# Patient Record
Sex: Female | Born: 1940 | Race: Black or African American | Hispanic: No | Marital: Single | Smoking: Former smoker
Health system: Southern US, Community
[De-identification: ages and names within clinical notes are randomized; demographics above are authoritative.]

## PROBLEM LIST (undated history)

## (undated) DIAGNOSIS — E119 Type 2 diabetes mellitus without complications: Secondary | ICD-10-CM

## (undated) DIAGNOSIS — H349 Unspecified retinal vascular occlusion: Secondary | ICD-10-CM

## (undated) DIAGNOSIS — H409 Unspecified glaucoma: Secondary | ICD-10-CM

## (undated) DIAGNOSIS — J302 Other seasonal allergic rhinitis: Secondary | ICD-10-CM

## (undated) DIAGNOSIS — I10 Essential (primary) hypertension: Secondary | ICD-10-CM

## (undated) HISTORY — PX: ABDOMINAL HYSTERECTOMY: SHX81

---

## 2017-04-21 ENCOUNTER — Encounter (HOSPITAL_BASED_OUTPATIENT_CLINIC_OR_DEPARTMENT_OTHER): Payer: Self-pay | Admitting: Emergency Medicine

## 2017-04-21 ENCOUNTER — Emergency Department (HOSPITAL_BASED_OUTPATIENT_CLINIC_OR_DEPARTMENT_OTHER)
Admission: EM | Admit: 2017-04-21 | Discharge: 2017-04-21 | Disposition: A | Discharge status: Home / Self Care | Payer: Medicare Other | Attending: Physician Assistant | Admitting: Physician Assistant

## 2017-04-21 ENCOUNTER — Emergency Department (HOSPITAL_BASED_OUTPATIENT_CLINIC_OR_DEPARTMENT_OTHER): Payer: Medicare Other

## 2017-04-21 DIAGNOSIS — Z87891 Personal history of nicotine dependence: Secondary | ICD-10-CM | POA: Insufficient documentation

## 2017-04-21 DIAGNOSIS — E119 Type 2 diabetes mellitus without complications: Secondary | ICD-10-CM | POA: Insufficient documentation

## 2017-04-21 DIAGNOSIS — I10 Essential (primary) hypertension: Secondary | ICD-10-CM | POA: Diagnosis not present

## 2017-04-21 DIAGNOSIS — H8109 Meniere's disease, unspecified ear: Secondary | ICD-10-CM | POA: Diagnosis not present

## 2017-04-21 DIAGNOSIS — R42 Dizziness and giddiness: Secondary | ICD-10-CM | POA: Diagnosis present

## 2017-04-21 HISTORY — DX: Type 2 diabetes mellitus without complications: E11.9

## 2017-04-21 HISTORY — DX: Unspecified retinal vascular occlusion: H34.9

## 2017-04-21 HISTORY — DX: Unspecified glaucoma: H40.9

## 2017-04-21 HISTORY — DX: Essential (primary) hypertension: I10

## 2017-04-21 HISTORY — DX: Other seasonal allergic rhinitis: J30.2

## 2017-04-21 LAB — URINALYSIS, ROUTINE W REFLEX MICROSCOPIC
BILIRUBIN URINE: NEGATIVE
GLUCOSE, UA: 250 mg/dL — AB
Hgb urine dipstick: NEGATIVE
KETONES UR: NEGATIVE mg/dL
Leukocytes, UA: NEGATIVE
Nitrite: NEGATIVE
PH: 7 (ref 5.0–8.0)
Protein, ur: >300 mg/dL — AB
Specific Gravity, Urine: 1.025 (ref 1.005–1.030)

## 2017-04-21 LAB — COMPREHENSIVE METABOLIC PANEL
ALBUMIN: 4 g/dL (ref 3.5–5.0)
ALT: 34 U/L (ref 14–54)
AST: 45 U/L — AB (ref 15–41)
Alkaline Phosphatase: 31 U/L — ABNORMAL LOW (ref 38–126)
Anion gap: 8 (ref 5–15)
BUN: 15 mg/dL (ref 6–20)
CHLORIDE: 103 mmol/L (ref 101–111)
CO2: 25 mmol/L (ref 22–32)
Calcium: 10 mg/dL (ref 8.9–10.3)
Creatinine, Ser: 1.13 mg/dL — ABNORMAL HIGH (ref 0.44–1.00)
GFR calc Af Amer: 54 mL/min — ABNORMAL LOW (ref >60)
GFR calc non Af Amer: 46 mL/min — ABNORMAL LOW (ref >60)
GLUCOSE: 265 mg/dL — AB (ref 65–99)
POTASSIUM: 4.2 mmol/L (ref 3.5–5.1)
SODIUM: 136 mmol/L (ref 135–145)
Total Bilirubin: 0.5 mg/dL (ref 0.3–1.2)
Total Protein: 7.3 g/dL (ref 6.5–8.1)

## 2017-04-21 LAB — CBC WITH DIFFERENTIAL/PLATELET
Basophils Absolute: 0 10*3/uL (ref 0.0–0.1)
Basophils Relative: 0 %
EOS PCT: 0 %
Eosinophils Absolute: 0 10*3/uL (ref 0.0–0.7)
HEMATOCRIT: 32.8 % — AB (ref 36.0–46.0)
Hemoglobin: 10.4 g/dL — ABNORMAL LOW (ref 12.0–15.0)
LYMPHS ABS: 1.3 10*3/uL (ref 0.7–4.0)
LYMPHS PCT: 18 %
MCH: 26.6 pg (ref 26.0–34.0)
MCHC: 31.7 g/dL (ref 30.0–36.0)
MCV: 83.9 fL (ref 78.0–100.0)
MONOS PCT: 5 %
Monocytes Absolute: 0.4 10*3/uL (ref 0.1–1.0)
NEUTROS ABS: 5.5 10*3/uL (ref 1.7–7.7)
Neutrophils Relative %: 77 %
PLATELETS: 182 10*3/uL (ref 150–400)
RBC: 3.91 MIL/uL (ref 3.87–5.11)
RDW: 15.7 % — AB (ref 11.5–15.5)
WBC: 7.2 10*3/uL (ref 4.0–10.5)

## 2017-04-21 LAB — URINALYSIS, MICROSCOPIC (REFLEX): RBC / HPF: NONE SEEN RBC/hpf (ref 0–5)

## 2017-04-21 MED ORDER — GI COCKTAIL ~~LOC~~
30.0000 mL | Freq: Once | ORAL | Status: AC
  Administered 2017-04-21: 30 mL via ORAL
  Filled 2017-04-21: qty 30

## 2017-04-21 MED ORDER — MECLIZINE HCL 12.5 MG PO TABS: 12.5000 mg | ORAL_TABLET | Freq: Three times a day (TID) | ORAL | 0 refills | Status: AC | PRN

## 2017-04-21 NOTE — ED Triage Notes (Signed)
Pt c/o dizziness, onset 0830; sts started with roaring in ears yesterday; also reports nausea

## 2017-04-21 NOTE — ED Provider Notes (Signed)
[facility]   CSN: [id number] Arrival date & time: 04/21/17  1114     History   Chief Complaint Chief Complaint  Patient presents with  . Dizziness    HPI Deborah Boyd is a 76 y.o. female.  HPI   Patient is 76 year old female with history of diabetes and hypertension and CRAO presenting with dizziness. Patient had acute dizziness while getting up from bed today. Patient had roaring in her ear starting 2 days ago.  Patient reporst she sat up from bed and all of a sudden had acute dizziness. Nausea, vomiting. She was unable to move without increasing the dizziness so she called her family to bring her here. Patient has no other weakness, numbness, neurologic symptoms.  On arrival patient with her eyes closed and not moving a bed.  Past Medical History:  Diagnosis Date  . Diabetes mellitus without complication (HCC)   . Glaucoma   . Hypertension   . Retinal vessel occlusion   . Seasonal allergies     There are no active problems to display for this patient.   Past Surgical History:  Procedure Laterality Date  . ABDOMINAL HYSTERECTOMY      OB History    No data available       Home Medications    Prior to Admission medications   Not on File    Family History History reviewed. No pertinent family history.  Social History Social History  Substance Use Topics  . Smoking status: Former Games developer  . Smokeless tobacco: Never Used  . Alcohol use No     Allergies   Erythromycin; Mobic [meloxicam]; Prevnar [pneumococcal 13-val conj vacc]; and Tessalon perles [benzonatate]   Review of Systems Review of Systems  Constitutional: Negative for activity change, fatigue and fever.  HENT: Negative for ear discharge and ear pain.   Eyes: Negative for pain.  Respiratory: Negative for shortness of breath.   Cardiovascular: Negative for chest pain.  Gastrointestinal: Negative for abdominal pain.  Neurological: Positive for dizziness.  Negative for tremors, syncope, speech difficulty, weakness, light-headedness, numbness and headaches.     Physical Exam Updated Vital Signs BP (!) 160/89   Pulse 78   Temp 98.3 F (36.8 C) (Oral)   Resp 20   SpO2 100%   Physical Exam  Constitutional: She is oriented to person, place, and time. She appears well-developed and well-nourished.  HENT:  Head: Normocephalic and atraumatic.  Right eye and left eye slightly different, baseline for patient.  Eyes: Right eye exhibits no discharge. Left eye exhibits no discharge.  Cardiovascular: Normal rate, regular rhythm and normal heart sounds.   No murmur heard. Pulmonary/Chest: Effort normal and breath sounds normal. She has no wheezes. She has no rales.  Abdominal: Soft. She exhibits no distension. There is tenderness.  Diffuse distractable tenderness  Musculoskeletal: Normal range of motion. She exhibits no edema.  Neurological: She is oriented to person, place, and time. No cranial nerve deficit.  Cranial nerves II through XII are intact. Extraocular movements are intact. Finger to nose intact. Romberg negative, walking normally. Without assistance.  Skin: Skin is warm and dry. She is not diaphoretic.  Psychiatric: She has a normal mood and affect.  Nursing note and vitals reviewed.    ED Treatments / Results  Labs (all labs ordered are listed, but only abnormal results are displayed) Labs Reviewed  COMPREHENSIVE METABOLIC PANEL  CBC WITH DIFFERENTIAL/PLATELET    EKG  EKG Interpretation None  Radiology No results found.  Procedures Procedures (including critical care time)  Medications Ordered in ED Medications - No data to display   Initial Impression / Assessment and Plan / ED Course  I have reviewed the triage vital signs and the nursing notes.  Pertinent labs & imaging results that were available during my care of the patient were reviewed by me and considered in my medical decision making (see chart  for details).     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 Patient had acute dizziness while getting up from bed today. Patient had roaring in her ear starting 2 days ago.  Patient reporst she sat up from bed and all of a sudden had acute dizziness. Nausea, vomiting. She was unable to move without increasing the dizziness so she called her family to bring her here. Patient has no other weakness, numbness, neurologic symptoms.  On arrival patient with her eyes closed and not moving a bed.  12:00 PM Patient is able to open her eyes move around, dizziness had abated. She is able to ambulate to the bathroom without issue and without assistance.  3:10 PM Patient's continued to be asymptomatic. She is eating drinking and ambulatory.  CT negative lab work reassuring (atient has chronic anemia). Episodic nature of this I think this is likely peripheral. Especially given the "roaring in her ears". 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 Doubt central proces. Patient was instructed and given information about this and will follow-up with both ENT and her primary care provider.  Final Clinical Impressions(s) / ED Diagnoses   Final diagnoses:  None    New Prescriptions New Prescriptions   No medications on file     Abelino Derrick, MD 04/21/17 1511

## 2017-04-21 NOTE — Discharge Instructions (Signed)
We think the likely have Mnire's disease today. Please use the medication provided if you experience vertigo symptoms again. 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

## 2019-05-04 IMAGING — CT CT HEAD W/O CM
3 series · 14 of 46 positions shown, 16 images · non-contrast
Comparison: None.

CLINICAL DATA: Dizziness.

EXAM:
CT HEAD WITHOUT CONTRAST
TECHNIQUE: Contiguous axial images were obtained from the base of the skull
through the vertex without intravenous contrast.

[Series 2: head wo · axial · 0.40mm/px · z∈[-170,-50]mm · 8 of 29 slices shown, 10 images]
[im 3/29  brain]
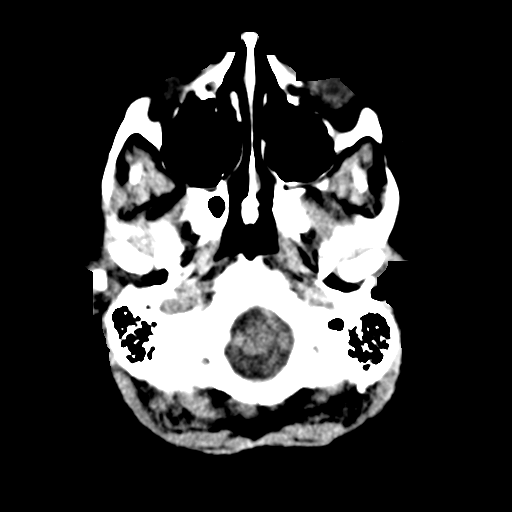
[im 3/29  bone]
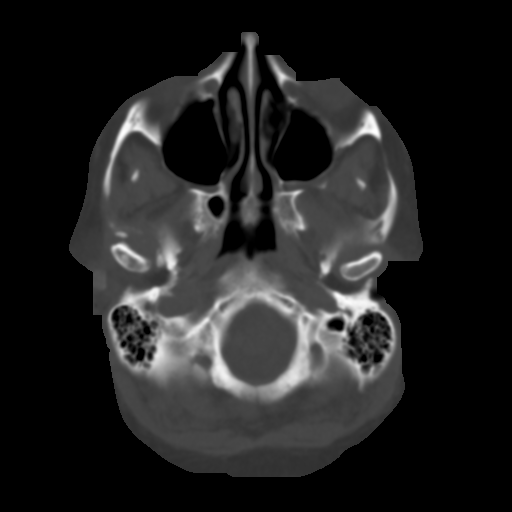
[im 7/29  brain]
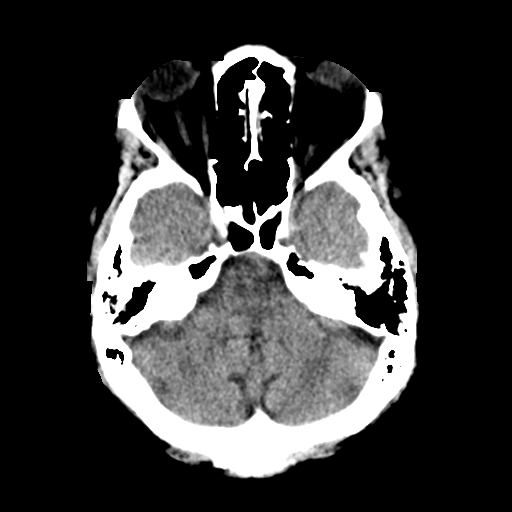
[im 10/29  brain]
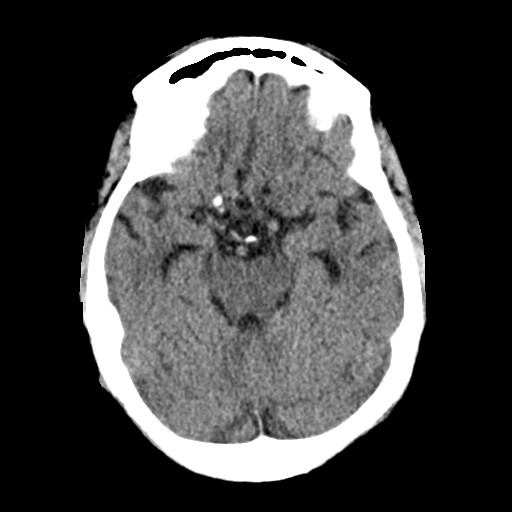
[im 13/29  brain]
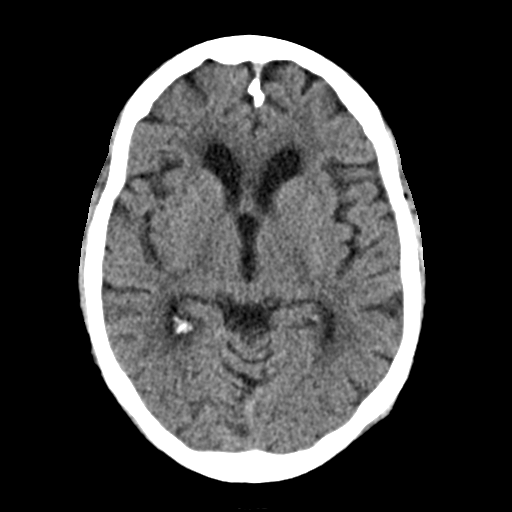
[im 17/29  brain]
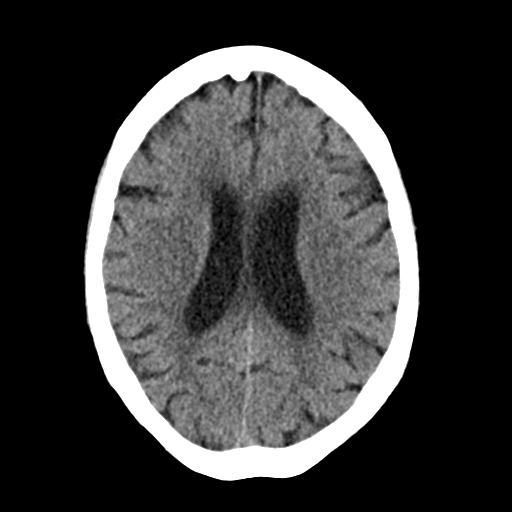
[im 17/29  bone]
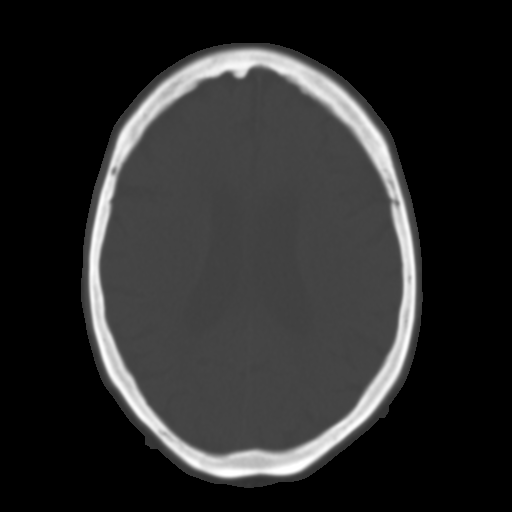
[im 20/29  brain]
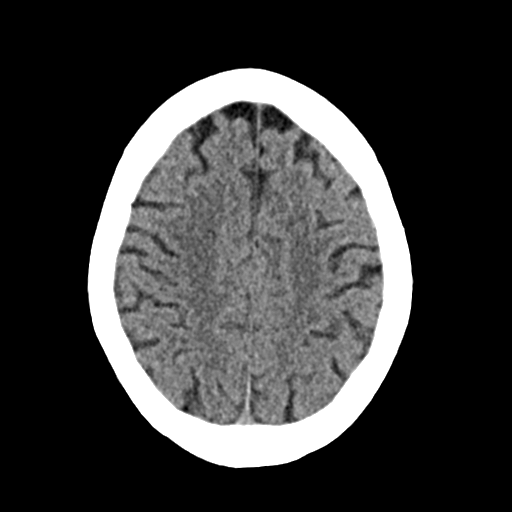
[im 23/29  brain]
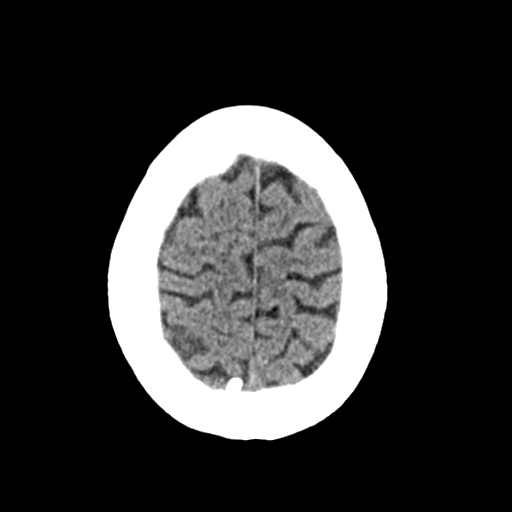
[im 27/29  brain]
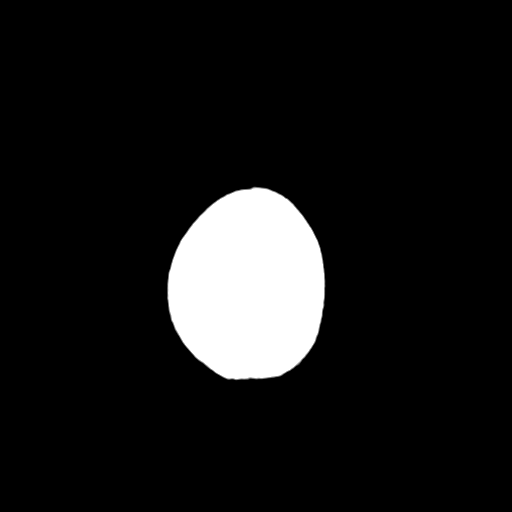

[Series 4: coronal soft · coronal · 0.28mm/px · 3 of 62 slices shown]
[im 21/62  brain]
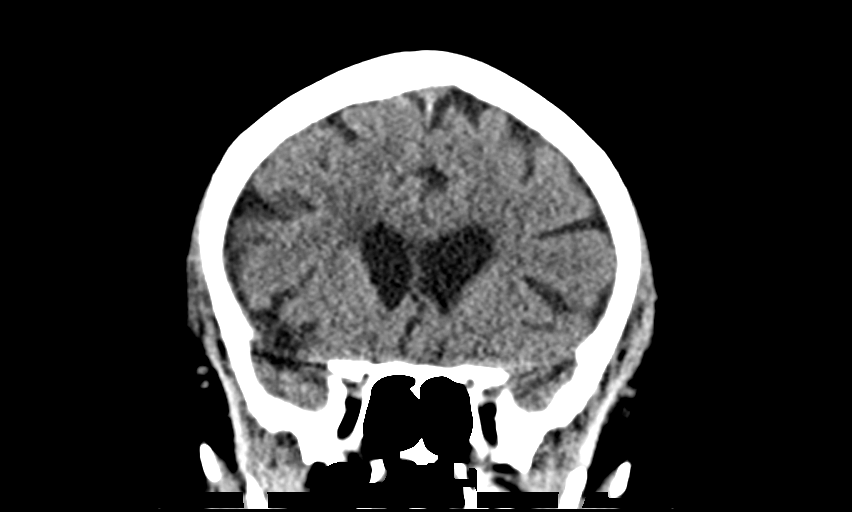
[im 28/62  brain]
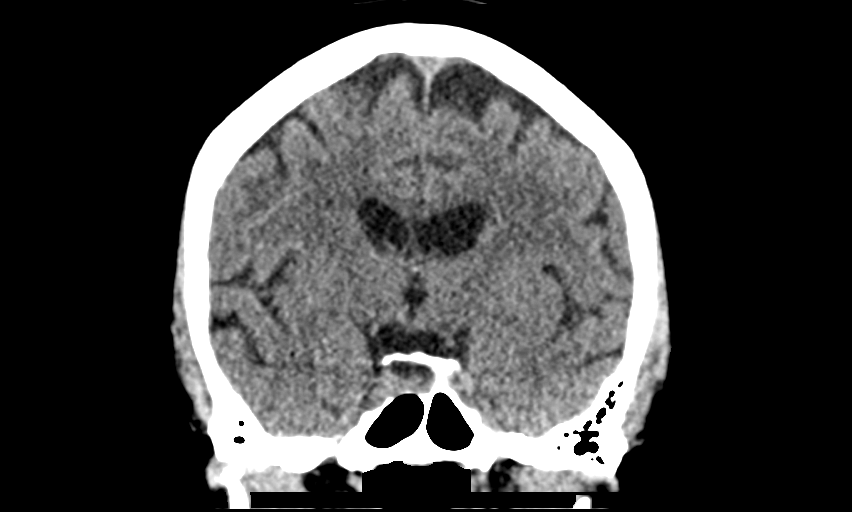
[im 34/62  brain]
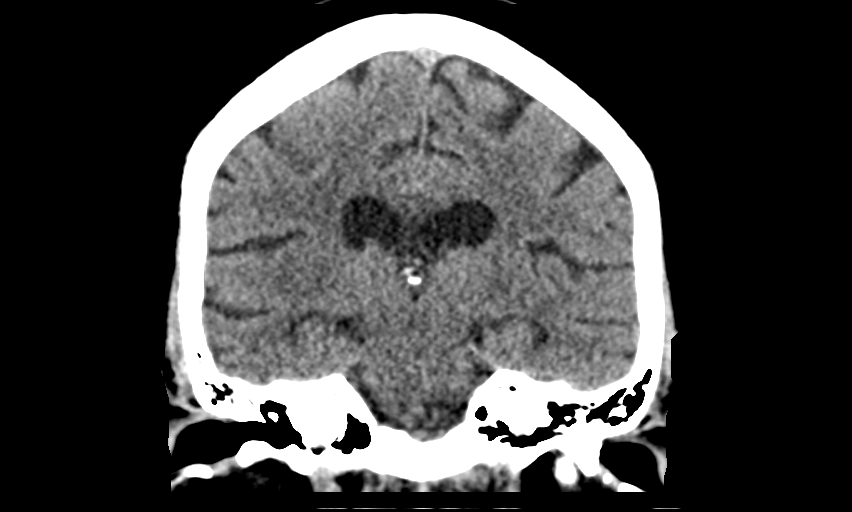

[Series 5: sag soft · sagittal · 0.29mm/px · 3 of 53 slices shown]
[im 18/53  brain]
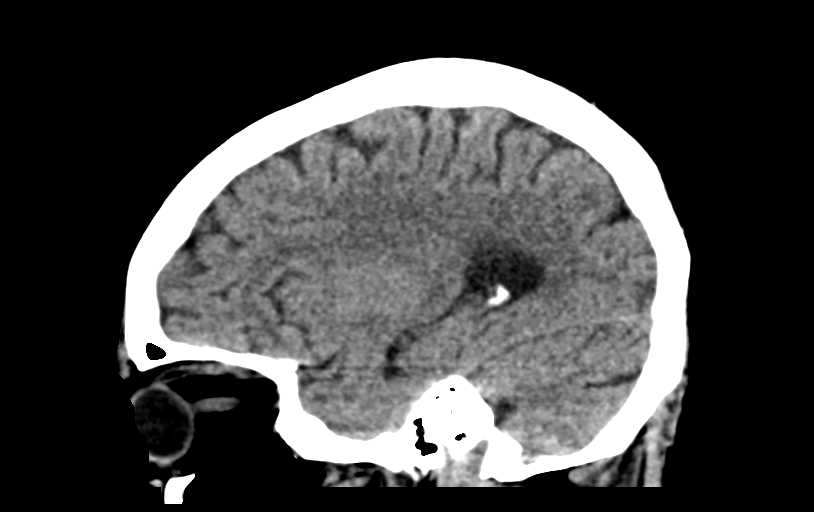
[im 27/53  brain]
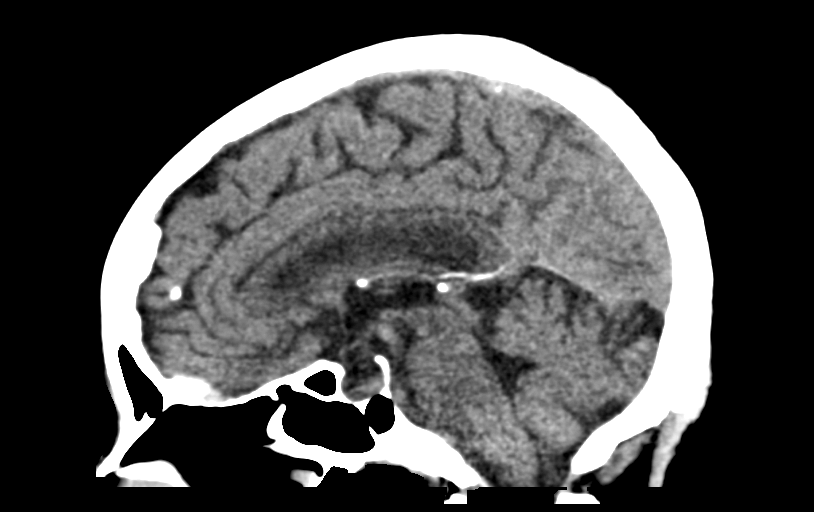
[im 35/53  brain]
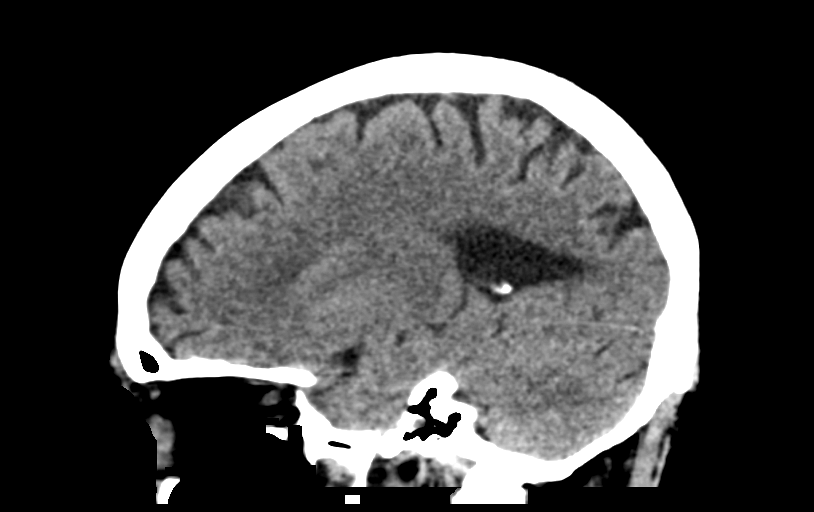

[14 of 46 positions shown; findings below may reference images not displayed]

FINDINGS: Brain: No subdural, epidural, or subarachnoid hemorrhage. Ventricles
and sulci are mildly prominent. Mild white matter changes are noted.
Cerebellum, brainstem, and basal cisterns are within normal limits.
No mass effect or midline shift. No acute cortical ischemia or
infarct.

Vascular: No hyperdense vessel or unexpected calcification.

Skull: Normal. Negative for fracture or focal lesion.

Sinuses/Orbits: No acute finding.

Other: None.
IMPRESSION: No acute intracranial abnormality to explain the patient's symptoms
is identified.
